# Patient Record
Sex: Female | Born: 1940 | Race: White | Hispanic: No | Marital: Married | Smoking: Never smoker
Health system: Southern US, Community
[De-identification: ages and names within clinical notes are randomized; demographics above are authoritative.]

## PROBLEM LIST (undated history)

## (undated) DIAGNOSIS — F209 Schizophrenia, unspecified: Secondary | ICD-10-CM

## (undated) DIAGNOSIS — F329 Major depressive disorder, single episode, unspecified: Secondary | ICD-10-CM

## (undated) DIAGNOSIS — F0390 Unspecified dementia without behavioral disturbance: Secondary | ICD-10-CM

## (undated) DIAGNOSIS — F32A Depression, unspecified: Secondary | ICD-10-CM

---

## 2003-08-20 ENCOUNTER — Other Ambulatory Visit: Payer: Self-pay

## 2004-04-13 ENCOUNTER — Other Ambulatory Visit: Payer: Self-pay

## 2004-04-14 ENCOUNTER — Inpatient Hospital Stay: Payer: Self-pay | Admitting: Surgery

## 2004-12-28 ENCOUNTER — Ambulatory Visit: Payer: Self-pay | Admitting: Family Medicine

## 2005-01-08 ENCOUNTER — Ambulatory Visit: Payer: Self-pay | Admitting: Internal Medicine

## 2005-08-19 ENCOUNTER — Ambulatory Visit: Payer: Self-pay | Admitting: Urology

## 2006-01-10 ENCOUNTER — Ambulatory Visit: Payer: Self-pay | Admitting: Family Medicine

## 2006-09-05 ENCOUNTER — Ambulatory Visit: Payer: Self-pay | Admitting: Family Medicine

## 2007-01-15 ENCOUNTER — Ambulatory Visit: Payer: Self-pay | Admitting: Family Medicine

## 2008-02-14 ENCOUNTER — Ambulatory Visit: Payer: Self-pay | Admitting: Internal Medicine

## 2008-02-25 ENCOUNTER — Ambulatory Visit: Payer: Self-pay | Admitting: Internal Medicine

## 2009-02-26 ENCOUNTER — Ambulatory Visit: Payer: Self-pay | Admitting: Internal Medicine

## 2010-03-01 ENCOUNTER — Ambulatory Visit: Payer: Self-pay | Admitting: Internal Medicine

## 2011-03-03 ENCOUNTER — Ambulatory Visit: Payer: Self-pay | Admitting: Internal Medicine

## 2012-03-06 ENCOUNTER — Ambulatory Visit: Payer: Self-pay | Admitting: Internal Medicine

## 2012-03-19 ENCOUNTER — Ambulatory Visit: Payer: Self-pay | Admitting: Gastroenterology

## 2013-03-07 ENCOUNTER — Ambulatory Visit: Payer: Self-pay | Admitting: Internal Medicine

## 2014-01-14 ENCOUNTER — Ambulatory Visit: Payer: Self-pay | Admitting: Internal Medicine

## 2014-04-11 ENCOUNTER — Ambulatory Visit: Payer: Self-pay | Admitting: Internal Medicine

## 2014-07-26 ENCOUNTER — Emergency Department: Payer: Self-pay | Admitting: Internal Medicine

## 2014-07-26 LAB — COMPREHENSIVE METABOLIC PANEL
ANION GAP: 10 (ref 7–16)
AST: 40 U/L — AB (ref 15–37)
Albumin: 3.7 g/dL (ref 3.4–5.0)
Alkaline Phosphatase: 64 U/L
BUN: 20 mg/dL — ABNORMAL HIGH (ref 7–18)
Bilirubin,Total: 0.6 mg/dL (ref 0.2–1.0)
CALCIUM: 9.3 mg/dL (ref 8.5–10.1)
CHLORIDE: 107 mmol/L (ref 98–107)
CO2: 25 mmol/L (ref 21–32)
Creatinine: 1.51 mg/dL — ABNORMAL HIGH (ref 0.60–1.30)
EGFR (African American): 43 — ABNORMAL LOW
EGFR (Non-African Amer.): 36 — ABNORMAL LOW
Glucose: 110 mg/dL — ABNORMAL HIGH (ref 65–99)
OSMOLALITY: 286 (ref 275–301)
POTASSIUM: 4.5 mmol/L (ref 3.5–5.1)
SGPT (ALT): 21 U/L
SODIUM: 142 mmol/L (ref 136–145)
Total Protein: 7.7 g/dL (ref 6.4–8.2)

## 2014-07-26 LAB — URINALYSIS, COMPLETE
BILIRUBIN, UR: NEGATIVE
Bacteria: NONE SEEN
GLUCOSE, UR: NEGATIVE mg/dL (ref 0–75)
Hyaline Cast: 2
Nitrite: NEGATIVE
PH: 5 (ref 4.5–8.0)
Protein: 30
RBC,UR: 12 /HPF (ref 0–5)
Specific Gravity: 1.018 (ref 1.003–1.030)
WBC UR: 60 /HPF (ref 0–5)

## 2014-07-26 LAB — CBC
HCT: 39 % (ref 35.0–47.0)
HGB: 12.7 g/dL (ref 12.0–16.0)
MCH: 30.3 pg (ref 26.0–34.0)
MCHC: 32.5 g/dL (ref 32.0–36.0)
MCV: 93 fL (ref 80–100)
Platelet: 292 10*3/uL (ref 150–440)
RBC: 4.18 10*6/uL (ref 3.80–5.20)
RDW: 12.8 % (ref 11.5–14.5)
WBC: 17 10*3/uL — ABNORMAL HIGH (ref 3.6–11.0)

## 2014-07-26 LAB — DRUG SCREEN, URINE

## 2014-07-26 LAB — DIFFERENTIAL
Basophil #: 0.1 10*3/uL (ref 0.0–0.1)
Basophil %: 0.6 %
EOS ABS: 0 10*3/uL (ref 0.0–0.7)
Eosinophil %: 0 %
Lymphocyte #: 1.9 10*3/uL (ref 1.0–3.6)
Lymphocyte %: 11.3 %
Monocyte #: 1.2 x10 3/mm — ABNORMAL HIGH (ref 0.2–0.9)
Monocyte %: 7 %
NEUTROS PCT: 81.1 %
Neutrophil #: 13.8 10*3/uL — ABNORMAL HIGH (ref 1.4–6.5)

## 2014-07-26 LAB — SALICYLATE LEVEL: Salicylates, Serum: <1.7 mg/dL

## 2014-07-26 LAB — ETHANOL: Ethanol: <3 mg/dL

## 2014-07-26 LAB — ACETAMINOPHEN LEVEL: Acetaminophen: <2 ug/mL

## 2014-07-28 LAB — URINE CULTURE

## 2014-07-31 LAB — CULTURE, BLOOD (SINGLE)

## 2014-08-15 ENCOUNTER — Emergency Department: Payer: Self-pay | Admitting: Emergency Medicine
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
## 2014-11-02 NOTE — Consult Note (Signed)
PATIENT NAME:  Kim Vaughn, Kim Vaughn MR#:  [id number] DATE OF BIRTH:  08/26/1940  DATE OF CONSULTATION:  08/15/2014  REFERRING PHYSICIAN:   CONSULTING PHYSICIAN:  Audery Amel, MD  IDENTIFYING INFORMATION AND REASON FOR CONSULTATION: This is a 74 year old woman who came into the hospital voluntarily.   CHIEF COMPLAINT: "I'm not hearing things or seeing things."   HISTORY OF PRESENT ILLNESS: Information obtained from the patient and the chart. The patient tells me that she was at home yesterday when she heard sounds that she believed were people having broken into her house. She was convinced again that someone had broken in and was trying to kill her. She was feeling very frightened and upset about it. The patient states that her mood has been feeling anxious, but denied feeling depressed. She denies suicidal or homicidal ideation. She says that she has been compliant with the medications they gave her at [facility]. She just got out of [facility] a few days ago after a stay for treating the same symptoms she is complaining of now. The patient did not do anything to harm herself, was not aggressive to anyone else, and is not voicing any threats to anyone. No evidence of substance abuse.   PAST PSYCHIATRIC HISTORY: She says that she has had these symptoms intermittently for 20 years. She had a psychiatric hospitalization recently at [facility], which apparently is her only hospitalization, but 20 years ago she said she also saw a psychiatrist. She does not know what diagnosis anyone gave her. She denies any history of suicide attempts. Denies any history of violence.   CURRENT MEDICATIONS: Evidently are olanzapine 10 mg twice a day, Lexapro 10 mg per day, Ativan p.r.n., and  trazodone 50 mg at night p.r.n.   PAST MEDICAL HISTORY: Denies any history of heart attacks or strokes or cancer. She does have high blood pressure.   SOCIAL HISTORY: The patient lives with her  husband. She tells me they have been married about 38 years. She does have an adult child who she stays in intermittent touch with. Says her relationship with her husband is good. She is not voicing any paranoia about him right now.   FAMILY HISTORY: No known family history at all.   SUBSTANCE ABUSE HISTORY: Denies any current or past substance abuse problems.   CURRENT MEDICATIONS: Lexapro 10 mg per day, hydrochlorothiazide 25 mg per day, lisinopril 5 mg per day, lorazepam 0.5 mg every 4 hours as needed for anxiety, Zyprexa 10 mg twice a day, potassium 20 mEq daily, trazodone 50 mg at bedtime.   ALLERGIES: CODEINE, IODINE, PERCOCET.   REVIEW OF SYSTEMS: Currently denies anything. Denies hallucinations, visual or auditory. Denies suicidal or homicidal ideation. Not voicing any complaints of pain or other physical symptoms.   MENTAL STATUS EXAMINATION: Elderly woman, looks her stated age, cooperative with the interview. Eye contact good. Psychomotor activity normal. Speech normal rate, tone and volume. Affect slightly puzzled. Mood stated as okay. Thoughts are lucid without loosening of associations or delusions. Denies auditory or visual hallucinations. Denies suicidal or homicidal ideation. Alert and oriented x 4. Judgment and insight intact. Normal intelligence. Can remember 3/3 objects immediately and 3/3 at 3 minutes.   LABORATORY RESULTS: Chemistry panel: Slightly high glucose 109, BUN elevated at 20, chloride elevated at 108, albumin low at 3.1. Alcohol level, acetaminophen, and salicylates all negative. CBC normal. Urinalysis normal. Drug screen negative.   VITAL SIGNS: Blood pressure 153/79, respirations 18, pulse 80, temperature 98.1.  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 Currently not depressed, not suicidal, not homicidal and her hallucinations have cleared up for the time being and she shows  good insight. The patient does not meet commitment criteria and does not require inpatient hospitalization.   TREATMENT PLAN: Psychoeducation completed. Encouraged the patient to continue medication as it may take longer for the full effect to start. I do not think she needs hospitalization any further and can be released from the hospital. The case discussed with Emergency Room doctor.   DIAGNOSIS, PRINCIPAL AND PRIMARY:  AXIS I: Psychosis, not otherwise specified.   SECONDARY DIAGNOSES: AXIS I: No further.  AXIS II: No diagnosis.  AXIS III: High blood pressure.    ____________________________ Audery AmelJohn T. Akiba Melfi, MD jtc:at D: 08/15/2014 17:18:19 ET T: 08/15/2014 17:56:16 ET JOB#: [id number]  cc: Audery AmelJohn T. Evora Schechter, MD, <Dictator> Audery AmelJOHN T Ophie Burrowes MD ELECTRONICALLY SIGNED 08/18/2014 10:42
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
## 2015-01-01 ENCOUNTER — Other Ambulatory Visit: Payer: Self-pay | Admitting: Internal Medicine

## 2015-01-01 DIAGNOSIS — Z1231 Encounter for screening mammogram for malignant neoplasm of breast: Secondary | ICD-10-CM

## 2015-05-19 ENCOUNTER — Emergency Department
Admission: EM | Admit: 2015-05-19 | Discharge: 2015-05-19 | Disposition: A | Discharge status: Home / Self Care | Payer: Medicare Other | Attending: Student | Admitting: Student

## 2015-05-19 ENCOUNTER — Encounter: Payer: Self-pay | Admitting: Emergency Medicine

## 2015-05-19 DIAGNOSIS — N390 Urinary tract infection, site not specified: Secondary | ICD-10-CM | POA: Diagnosis not present

## 2015-05-19 DIAGNOSIS — R4182 Altered mental status, unspecified: Secondary | ICD-10-CM | POA: Diagnosis present

## 2015-05-19 DIAGNOSIS — R443 Hallucinations, unspecified: Secondary | ICD-10-CM

## 2015-05-19 DIAGNOSIS — F0391 Unspecified dementia with behavioral disturbance: Secondary | ICD-10-CM | POA: Diagnosis not present

## 2015-05-19 HISTORY — DX: Major depressive disorder, single episode, unspecified: F32.9

## 2015-05-19 HISTORY — DX: Depression, unspecified: F32.A

## 2015-05-19 HISTORY — DX: Unspecified dementia, unspecified severity, without behavioral disturbance, psychotic disturbance, mood disturbance, and anxiety: F03.90

## 2015-05-19 LAB — URINALYSIS COMPLETE WITH MICROSCOPIC (ARMC ONLY)
BILIRUBIN URINE: NEGATIVE
Glucose, UA: NEGATIVE mg/dL
HGB URINE DIPSTICK: NEGATIVE
NITRITE: NEGATIVE
PH: 5 (ref 5.0–8.0)
Protein, ur: 30 mg/dL — AB
Specific Gravity, Urine: 1.026 (ref 1.005–1.030)

## 2015-05-19 LAB — CBC
HCT: 40.7 % (ref 35.0–47.0)
HEMOGLOBIN: 13.5 g/dL (ref 12.0–16.0)
MCH: 30.6 pg (ref 26.0–34.0)
MCHC: 33.2 g/dL (ref 32.0–36.0)
MCV: 92.2 fL (ref 80.0–100.0)
PLATELETS: 262 10*3/uL (ref 150–440)
RBC: 4.41 MIL/uL (ref 3.80–5.20)
RDW: 13 % (ref 11.5–14.5)
WBC: 12.9 10*3/uL — ABNORMAL HIGH (ref 3.6–11.0)

## 2015-05-19 LAB — URINE DRUG SCREEN, QUALITATIVE (ARMC ONLY)
Amphetamines, Ur Screen: NOT DETECTED
BARBITURATES, UR SCREEN: NOT DETECTED
BENZODIAZEPINE, UR SCRN: NOT DETECTED
Cannabinoid 50 Ng, Ur ~~LOC~~: NOT DETECTED
Cocaine Metabolite,Ur ~~LOC~~: NOT DETECTED
MDMA (Ecstasy)Ur Screen: NOT DETECTED
METHADONE SCREEN, URINE: NOT DETECTED
OPIATE, UR SCREEN: NOT DETECTED
PHENCYCLIDINE (PCP) UR S: NOT DETECTED
Tricyclic, Ur Screen: NOT DETECTED

## 2015-05-19 LAB — COMPREHENSIVE METABOLIC PANEL
ALT: 17 U/L (ref 14–54)
ANION GAP: 9 (ref 5–15)
AST: 31 U/L (ref 15–41)
Albumin: 4.1 g/dL (ref 3.5–5.0)
Alkaline Phosphatase: 50 U/L (ref 38–126)
BILIRUBIN TOTAL: 1.1 mg/dL (ref 0.3–1.2)
BUN: 33 mg/dL — ABNORMAL HIGH (ref 6–20)
CO2: 26 mmol/L (ref 22–32)
CREATININE: 1.08 mg/dL — AB (ref 0.44–1.00)
Calcium: 9.4 mg/dL (ref 8.9–10.3)
Chloride: 106 mmol/L (ref 101–111)
GFR calc Af Amer: 57 mL/min — ABNORMAL LOW (ref >60)
GFR, EST NON AFRICAN AMERICAN: 49 mL/min — AB (ref >60)
Glucose, Bld: 100 mg/dL — ABNORMAL HIGH (ref 65–99)
Potassium: 3.9 mmol/L (ref 3.5–5.1)
Sodium: 141 mmol/L (ref 135–145)
TOTAL PROTEIN: 7.5 g/dL (ref 6.5–8.1)

## 2015-05-19 LAB — ACETAMINOPHEN LEVEL: Acetaminophen (Tylenol), Serum: <10 ug/mL — ABNORMAL LOW (ref 10–30)

## 2015-05-19 LAB — ETHANOL

## 2015-05-19 LAB — SALICYLATE LEVEL: Salicylate Lvl: <4 mg/dL (ref 2.8–30.0)

## 2015-05-19 MED ORDER — CEPHALEXIN 500 MG PO CAPS
500.0000 mg | ORAL_CAPSULE | Freq: Once | ORAL | Status: AC
  Administered 2015-05-19: 500 mg via ORAL
  Filled 2015-05-19: qty 1

## 2015-05-19 MED ORDER — CEPHALEXIN 500 MG PO CAPS: 500.0000 mg | ORAL_CAPSULE | Freq: Two times a day (BID) | ORAL | Status: AC

## 2015-05-19 NOTE — ED Notes (Addendum)
Pt's spouse is at her bedside - water provided  She has 3+leuks in her urine   EDP gayle will be going in to speak with her and her spouse

## 2015-05-19 NOTE — ED Provider Notes (Signed)
[facility] Emergency Department Provider Note  ____________________________________________  Time seen: Approximately 12:50 PM  I have reviewed the triage vital signs and the nursing notes.   HISTORY  Chief Complaint Hallucinations and Altered Mental Status  Caveat-history of present illness and review of systems limited secondary to the patient's dementia. Information obtained from her husband at bedside.  HPI ROGENE Vaughn is a 74 y.o. female with history of dementia and hypertension who presents for evaluation of nearly one week decreased appetite and delusions and paranoia increased from baseline. Has reports that she is keeping herself locked in her bedroom. He uses the key to open the bedroom but she for the most part she has stayed in there for the past several days. She is eating much less. She is voicing concern that someone is trying to enter the room to kill her. Husband reports this has happened to her in the past in the setting of urinary tract infection. No falls. No vomiting, diarrhea, fevers or chills. The patient has no pain complaints, no complaints at all just stating "I came here to rest a little".   Past Medical History  Diagnosis Date  . Depression   . Dementia     There are no active problems to display for this patient.   History reviewed. No pertinent past surgical history.  Current Outpatient Rx  Name  Route  Sig  Dispense  Refill  . cephALEXin (KEFLEX) 500 MG capsule   Oral   Take 1 capsule (500 mg total) by mouth 2 (two) times daily.   14 capsule   0     Allergies Iodine  History reviewed. No pertinent family history.  Social History Social History  Substance Use Topics  . Smoking status: Never Smoker   . Smokeless tobacco: None  . Alcohol Use: No    Review of Systems Constitutional: No fever/chills Gastrointestinal:  no vomiting.  No diarrhea.    Caveat-history of present illness and review of systems  limited secondary to the patient's dementia. Information obtained from her husband at bedside. ____________________________________________   PHYSICAL EXAM:  VITAL SIGNS: ED Triage Vitals  Enc Vitals Group     BP 05/19/15 1120 154/73 mmHg     Pulse Rate 05/19/15 1120 93     Resp 05/19/15 1120 20     Temp 05/19/15 1120 97.6 F (36.4 C)     Temp Source 05/19/15 1120 Oral     SpO2 05/19/15 1120 95 %     Weight 05/19/15 1120 130 lb (58.968 kg)     Height 05/19/15 1120  (1.549 m)     Head Cir --      Peak Flow --      Pain Score 05/19/15 1121 0     Pain Loc --      Pain Edu? --      Excl. in GC? --     Constitutional: Alert and oriented x 3. Well appearing and in no acute distress. Eyes: Conjunctivae are normal. PERRL. EOMI. Head: Atraumatic. Nose: No congestion/rhinnorhea. Mouth/Throat: Mucous membranes are moist.  Oropharynx non-erythematous. Neck: No stridor. Cardiovascular: Normal rate, regular rhythm. Grossly normal heart sounds.  Good peripheral circulation. Respiratory: Normal respiratory effort.  No retractions. Lungs CTAB. Gastrointestinal: Soft and nontender. No distention.  No CVA tenderness. Genitourinary: deferred Musculoskeletal: No lower extremity tenderness nor edema.  No joint effusions. Neurologic:  Normal speech and language. No gross focal neurologic deficits are appreciated. No gait instability. Skin:  Skin  is warm, dry and intact. No rash noted. Psychiatric: Mood and affect are normal. Speech and behavior are normal.  ____________________________________________   LABS (all labs ordered are listed, but only abnormal results are displayed)  Labs Reviewed  COMPREHENSIVE METABOLIC PANEL - Abnormal; Notable for the following:    Glucose, Bld 100 (*)    BUN 33 (*)    Creatinine, Ser 1.08 (*)    GFR calc non Af Amer 49 (*)    GFR calc Af Amer 57 (*)    All other components within normal limits  CBC - Abnormal; Notable for the following:    WBC  12.9 (*)    All other components within normal limits  ACETAMINOPHEN LEVEL - Abnormal; Notable for the following:    Acetaminophen (Tylenol), Serum <10 (*)    All other components within normal limits  URINALYSIS COMPLETEWITH MICROSCOPIC ([facility] ONLY) - Abnormal; Notable for the following:    Color, Urine YELLOW (*)    APPearance HAZY (*)    Ketones, ur 1+ (*)    Protein, ur 30 (*)    Leukocytes, UA 3+ (*)    Bacteria, UA FEW (*)    Squamous Epithelial / LPF 6-30 (*)    All other components within normal limits  ETHANOL  SALICYLATE LEVEL  URINE DRUG SCREEN, QUALITATIVE ([facility] ONLY)   ____________________________________________  EKG  none ____________________________________________  RADIOLOGY  none ____________________________________________   PROCEDURES  Procedure(s) performed: None  Critical Care performed: No  ____________________________________________   INITIAL IMPRESSION / ASSESSMENT AND PLAN / ED COURSE  Pertinent labs & imaging results that were available during my care of the patient were reviewed by me and considered in my medical decision making (see chart for details).  Kim Vaughn is a 74 y.o. female with history of dementia and hypertension who presents for evaluation of nearly one week decreased appetite and delusions and paranoia increased from baseline. On exam She is very well-appearing and in no acute distress. Vital signs stable, she is afebrile. She has no acute medical complaints and benign examination. Labs reviewed and are notable for very mild creatinine elevation at 1.08. Mild leukocytosis which appears chronic with compared to prior. Urinalysis is concerning for urinary tract infection. We'll treat with Keflex. I suspect urinary tract infection is the cause of her increased paranoia and delusions as well as decreased by mouth appetite and I think she is appropriate for outpatient treatment given her well-appearance. I discussed this  with her husband and he would like to take her home so that she can follow-up for her follow-up appointments next [facility] with [facility] as well as neurology. We discussed treatment with Keflex, immediate ER return precautions and he is comfortable with the discharge plan. ____________________________________________   FINAL CLINICAL IMPRESSION(S) / ED DIAGNOSES  Final diagnoses:  UTI (lower urinary tract infection)  Hallucinations  Dementia, with behavioral disturbance      Gayla DossEryka A Vernee Baines, MD 05/19/15 [phone]

## 2015-05-19 NOTE — ED Notes (Signed)
Discharge instructions reviewed with her and her spouse and they both verbalized agreement and understanding   Follow up instructions also reviewed and her spouse reports that she will have an app[facility] Thursday and an appt for an EEG on Friday  She denies pain at this time
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
## 2015-05-19 NOTE — ED Notes (Addendum)
BEHAVIORAL HEALTH ROUNDING Patient sleeping: No. Patient alert and oriented:  Oriented to self, place Behavior appropriate: Yes.  ; If no, describe:  Nutrition and fluids offered: yes Toileting and hygiene offered: Yes  Sitter present: q15 minute observations and security monitoring Law enforcement present: Yes  ODS

## 2015-05-19 NOTE — ED Notes (Signed)

## 2015-05-19 NOTE — ED Notes (Signed)
BEHAVIORAL HEALTH ROUNDING Patient sleeping: No. Patient alert and oriented: oriented to self and place Behavior appropriate: Yes.  ; If no, describe:  Nutrition and fluids offered: yes Toileting and hygiene offered: Yes  Sitter present: q15 minute observations and security camera monitoring Law enforcement present: Yes  ODS

## 2015-05-21 ENCOUNTER — Emergency Department
Admission: EM | Admit: 2015-05-21 | Discharge: 2015-05-22 | Disposition: A | Discharge status: Other Acute Inpatient Hospital | Payer: Medicare Other | Attending: Emergency Medicine | Admitting: Emergency Medicine

## 2015-05-21 ENCOUNTER — Encounter: Payer: Self-pay | Admitting: Emergency Medicine

## 2015-05-21 DIAGNOSIS — I10 Essential (primary) hypertension: Secondary | ICD-10-CM | POA: Insufficient documentation

## 2015-05-21 DIAGNOSIS — Z792 Long term (current) use of antibiotics: Secondary | ICD-10-CM | POA: Insufficient documentation

## 2015-05-21 DIAGNOSIS — F22 Delusional disorders: Secondary | ICD-10-CM | POA: Insufficient documentation

## 2015-05-21 DIAGNOSIS — R443 Hallucinations, unspecified: Secondary | ICD-10-CM | POA: Diagnosis present

## 2015-05-21 DIAGNOSIS — N390 Urinary tract infection, site not specified: Secondary | ICD-10-CM | POA: Diagnosis not present

## 2015-05-21 LAB — URINALYSIS COMPLETE WITH MICROSCOPIC (ARMC ONLY)
BILIRUBIN URINE: NEGATIVE
GLUCOSE, UA: NEGATIVE mg/dL
HGB URINE DIPSTICK: NEGATIVE
NITRITE: NEGATIVE
Protein, ur: 30 mg/dL — AB
SPECIFIC GRAVITY, URINE: 1.02 (ref 1.005–1.030)
pH: 6 (ref 5.0–8.0)

## 2015-05-21 LAB — COMPREHENSIVE METABOLIC PANEL
ALT: 18 U/L (ref 14–54)
AST: 26 U/L (ref 15–41)
Albumin: 4 g/dL (ref 3.5–5.0)
Alkaline Phosphatase: 48 U/L (ref 38–126)
Anion gap: 9 (ref 5–15)
BILIRUBIN TOTAL: 1.2 mg/dL (ref 0.3–1.2)
BUN: 20 mg/dL (ref 6–20)
CALCIUM: 9.5 mg/dL (ref 8.9–10.3)
CHLORIDE: 102 mmol/L (ref 101–111)
CO2: 28 mmol/L (ref 22–32)
CREATININE: 1.01 mg/dL — AB (ref 0.44–1.00)
GFR, EST NON AFRICAN AMERICAN: 53 mL/min — AB (ref >60)
Glucose, Bld: 101 mg/dL — ABNORMAL HIGH (ref 65–99)
Potassium: 4.1 mmol/L (ref 3.5–5.1)
Sodium: 139 mmol/L (ref 135–145)
TOTAL PROTEIN: 7.7 g/dL (ref 6.5–8.1)

## 2015-05-21 LAB — CBC
HCT: 39.8 % (ref 35.0–47.0)
Hemoglobin: 13.1 g/dL (ref 12.0–16.0)
MCH: 30.7 pg (ref 26.0–34.0)
MCHC: 33 g/dL (ref 32.0–36.0)
MCV: 93 fL (ref 80.0–100.0)
PLATELETS: 258 10*3/uL (ref 150–440)
RBC: 4.28 MIL/uL (ref 3.80–5.20)
RDW: 12.7 % (ref 11.5–14.5)
WBC: 10.9 10*3/uL (ref 3.6–11.0)

## 2015-05-21 MED ORDER — LORAZEPAM 0.5 MG PO TABS: 0.5000 mg | ORAL_TABLET | Freq: Four times a day (QID) | ORAL | Status: DC | PRN

## 2015-05-21 MED ORDER — OLANZAPINE 5 MG PO TABS
2.5000 mg | ORAL_TABLET | Freq: Every day | ORAL | Status: DC
  Administered 2015-05-22: 2.5 mg via ORAL
  Filled 2015-05-21: qty 0.5

## 2015-05-21 NOTE — ED Notes (Signed)
Pt ambulated to bathroom at this time independently with no concerns, pt tolerated well at this time.

## 2015-05-21 NOTE — ED Notes (Signed)
SOC machine placed at bedside at this time

## 2015-05-21 NOTE — ED Notes (Signed)
Pt brought to ed from [facility] via [facility] for hallucinations. Pt has been paranoid and thinking that someone is putting something in her food. Recently dx with UTI and will not take meds for same and will not eat. Also thinks people are out to get her.  Pt with IVC papers.
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
## 2015-05-22 DIAGNOSIS — F22 Delusional disorders: Secondary | ICD-10-CM | POA: Diagnosis not present

## 2015-05-22 MED ORDER — CEPHALEXIN 500 MG PO CAPS
500.0000 mg | ORAL_CAPSULE | Freq: Four times a day (QID) | ORAL | Status: DC
  Administered 2015-05-22 (×2): 500 mg via ORAL
  Filled 2015-05-22 (×2): qty 1

## 2015-05-22 NOTE — ED Provider Notes (Signed)
-----------------------------------------   5:36 AM on 05/22/2015 -----------------------------------------   Blood pressure 140/61, pulse 69, temperature 98.1 F (36.7 C), temperature source Oral, SpO2 97 %.  The patient had no acute events since last update.  Calm and cooperative at this time.  Disposition is pending per Psychiatry/Behavioral Medicine team recommendations.     Loleta Roseory Biruk Troia, MD 05/22/15 [phone]

## 2015-05-22 NOTE — ED Notes (Signed)
BEHAVIORAL HEALTH ROUNDING Patient sleeping: Yes.   Patient alert and oriented: not applicable Behavior appropriate: Yes.  ; If no, describe:  Nutrition and fluids offered: Yes  Toileting and hygiene offered: Yes  Sitter present: no Law enforcement present: Yes  

## 2015-05-22 NOTE — ED Notes (Signed)
BEHAVIORAL HEALTH ROUNDING Patient sleeping: No. Patient alert and oriented: no Behavior appropriate: Yes.  ; If no, describe:  Nutrition and fluids offered: Yes  Toileting and hygiene offered: Yes  Sitter present: no Law enforcement present: Yes  

## 2015-05-22 NOTE — ED Notes (Signed)
BEHAVIORAL HEALTH ROUNDING Patient sleeping: No. Patient alert and oriented: no Behavior appropriate: Yes.  ; If no, describe: saying she thinks someone is going to shoot thru the walls, wants staff to be very quiet, attempted reassurance to pt Nutrition and fluids offered: Yes  Toileting and hygiene offered: Yes  Sitter present: no Law enforcement present: Yes

## 2015-05-22 NOTE — ED Notes (Signed)

## 2015-05-22 NOTE — ED Notes (Signed)
BEHAVIORAL HEALTH ROUNDING Patient sleeping: Yes.   Patient alert and oriented: no Behavior appropriate: Yes.  ; If no, describe:  Nutrition and fluids offered: Yes  Toileting and hygiene offered: Yes  Sitter present: no Law enforcement present: Yes  

## 2015-05-22 NOTE — ED Notes (Signed)
Pt husband with her

## 2015-05-22 NOTE — ED Notes (Signed)
Patient resting comfortably in room. No complaints or concerns voiced. No distress or abnormal behavior noted. Will continue to monitor with security cameras. Q 15 minute rounds continue. 

## 2015-05-22 NOTE — ED Notes (Signed)

## 2015-05-22 NOTE — ED Notes (Signed)
BEHAVIORAL HEALTH ROUNDING Patient sleeping: Yes.   Patient alert and oriented: not applicable Behavior appropriate: Yes.  ; If no, describe: sleeping Nutrition and fluids offered: Yes  Toileting and hygiene offered: Yes  Sitter present: no Law enforcement present: Yes  

## 2015-05-22 NOTE — ED Notes (Signed)
Report received from Lauryn RN. Patient care assumed. Patient/RN introduction complete. Will continue to monitor.  

## 2015-05-22 NOTE — ED Notes (Signed)
No Zyprexa in pyxis at this time, pharmacy called, will send to ED. Pt made aware and verbalized understanding at this time

## 2015-05-22 NOTE — [facility] Counselor (Signed)
Pt [facility] been [facility], [facility].   Pt's husband has been informed of SOC recommendation for inpatient placement. Husband stated that Pt has appt. Scheduled [facility] and inquired if Pt would be able to attend. Writer provided husband with phone number so that he may contact Pt's RN.

## 2015-05-22 NOTE — ED Notes (Signed)
Pt resting in bed comfortably with eyes closed, no distress or discomfort noted, will continue to monitor.  Safety rounds being performed q15 min per md order.

## 2015-05-22 NOTE — BH Assessment (Signed)
[facility].  Accepting physician is Dr. Althea Grimmerosado.  Call report to [phone] Representative was Cedric.  ER Staff Misty Stanley(Lisa, ER Sect.; Dr. Alphonzo LemmingsMcShane, ER MD & Nicholos JohnsKathleen Patient's Nurse) have been made aware it.   Pt.'s Family/Support System Ebony Cargo(Clayton Sakai/[phone]) have been updated as well.

## 2015-05-22 NOTE — ED Notes (Signed)
Patient assigned to appropriate care area. Patient oriented to unit/care area: Informed that, for their safety, care areas are designed for safety and monitored by security cameras at all times; and visiting hours explained to patient. Patient verbalizes understanding, and verbal contract for safety obtained. 

## 2015-05-24 LAB — URINE CULTURE

## 2015-08-13 ENCOUNTER — Emergency Department
Admission: EM | Admit: 2015-08-13 | Discharge: 2015-08-14 | Disposition: A | Discharge status: Other Acute Inpatient Hospital | Payer: Medicare Other | Attending: Emergency Medicine | Admitting: Emergency Medicine

## 2015-08-13 ENCOUNTER — Encounter: Payer: Self-pay | Admitting: Emergency Medicine

## 2015-08-13 DIAGNOSIS — F209 Schizophrenia, unspecified: Secondary | ICD-10-CM | POA: Diagnosis not present

## 2015-08-13 DIAGNOSIS — F200 Paranoid schizophrenia: Secondary | ICD-10-CM | POA: Diagnosis not present

## 2015-08-13 DIAGNOSIS — Z79899 Other long term (current) drug therapy: Secondary | ICD-10-CM | POA: Insufficient documentation

## 2015-08-13 DIAGNOSIS — Z792 Long term (current) use of antibiotics: Secondary | ICD-10-CM | POA: Insufficient documentation

## 2015-08-13 DIAGNOSIS — Z046 Encounter for general psychiatric examination, requested by authority: Secondary | ICD-10-CM

## 2015-08-13 DIAGNOSIS — F0390 Unspecified dementia without behavioral disturbance: Secondary | ICD-10-CM

## 2015-08-13 DIAGNOSIS — R443 Hallucinations, unspecified: Secondary | ICD-10-CM | POA: Diagnosis present

## 2015-08-13 HISTORY — DX: Schizophrenia, unspecified: F20.9

## 2015-08-13 LAB — COMPREHENSIVE METABOLIC PANEL
ALK PHOS: 50 U/L (ref 38–126)
ALT: 13 U/L — AB (ref 14–54)
AST: 17 U/L (ref 15–41)
Albumin: 4.3 g/dL (ref 3.5–5.0)
Anion gap: 11 (ref 5–15)
BUN: 23 mg/dL — ABNORMAL HIGH (ref 6–20)
CHLORIDE: 105 mmol/L (ref 101–111)
CO2: 26 mmol/L (ref 22–32)
Calcium: 9.4 mg/dL (ref 8.9–10.3)
Creatinine, Ser: 1 mg/dL (ref 0.44–1.00)
GFR calc Af Amer: >60 mL/min (ref >60)
GFR calc non Af Amer: 54 mL/min — ABNORMAL LOW (ref >60)
Glucose, Bld: 114 mg/dL — ABNORMAL HIGH (ref 65–99)
Potassium: 3.9 mmol/L (ref 3.5–5.1)
SODIUM: 142 mmol/L (ref 135–145)
Total Bilirubin: 0.5 mg/dL (ref 0.3–1.2)
Total Protein: 7.4 g/dL (ref 6.5–8.1)

## 2015-08-13 LAB — SALICYLATE LEVEL

## 2015-08-13 LAB — CBC
HCT: 41 % (ref 35.0–47.0)
Hemoglobin: 13.4 g/dL (ref 12.0–16.0)
MCH: 30.9 pg (ref 26.0–34.0)
MCHC: 32.8 g/dL (ref 32.0–36.0)
MCV: 94.2 fL (ref 80.0–100.0)
PLATELETS: 236 10*3/uL (ref 150–440)
RBC: 4.35 MIL/uL (ref 3.80–5.20)
RDW: 13.2 % (ref 11.5–14.5)
WBC: 10.3 10*3/uL (ref 3.6–11.0)

## 2015-08-13 LAB — ACETAMINOPHEN LEVEL

## 2015-08-13 LAB — ETHANOL

## 2015-08-13 MED ORDER — OLANZAPINE 10 MG PO TBDP
10.0000 mg | ORAL_TABLET | Freq: Every day | ORAL | Status: DC
  Administered 2015-08-13: 10 mg via ORAL
  Filled 2015-08-13 (×2): qty 1

## 2015-08-13 NOTE — BH Assessment (Signed)
Referral information for Geriatric Placement have been faxed to;    [facility]([phone]),    [facility]([phone]),    [phone]),    [facility]([phone]),    [facility] [phone])    [facility] [phone]).

## 2015-08-13 NOTE — [facility] Counselor (Signed)
Patient has been accepted at [facility].  Admitting MD is Dr. Roselle Locus.  Pt can report after 9 am tomorrow.  Call report to [phone].

## 2015-08-13 NOTE — ED Provider Notes (Addendum)
[facility] Emergency Department Provider Note  ____________________________________________   I have reviewed the triage vital signs and the nursing notes.   HISTORY  Chief Complaint Hallucinations    HPI Kim Vaughn is a 75 y.o. female who presents today complaining of nothing. She states that she does not want to be here and she is not suffering from hallucinations she is taking her medications and she wants to go home. However she is under IVC commitment. Paperwork says that she is not taking her meds, she is wandering off trying to find people that do not exist, she is hallucinating, and she is worried about people living on the second story of a one-story house. Patient denies all of this. She has no complaints.  Past Medical History  Diagnosis Date  . Depression   . Dementia   . Schizophrenia [facility])     Patient Active Problem List   Diagnosis Date Noted  . Schizophrenia (HCC) 08/13/2015  . Dementia 08/13/2015    No past surgical history on file.  Current Outpatient Rx  Name  Route  Sig  Dispense  Refill  . escitalopram (LEXAPRO) 10 MG tablet   Oral   Take 10 mg by mouth daily.         . QUEtiapine (SEROQUEL) 100 MG tablet   Oral   Take 100 mg by mouth at bedtime.         . cephALEXin (KEFLEX) 500 MG capsule   Oral   Take 1 capsule (500 mg total) by mouth 2 (two) times daily.   14 capsule   0     Allergies Iodine  No family history on file.  Social History Social History  Substance Use Topics  . Smoking status: Never Smoker   . Smokeless tobacco: None  . Alcohol Use: No    Review of Systems Constitutional: No fever/chills Eyes: No visual changes. ENT: No sore throat. No stiff neck no neck pain Cardiovascular: Denies chest pain. Respiratory: Denies shortness of breath. Gastrointestinal:   no vomiting.  No diarrhea.  No constipation. Genitourinary: Negative for dysuria. Musculoskeletal:  Negative lower extremity swelling Skin: Negative for rash. Neurological: Negative for headaches, focal weakness or numbness. 10-point ROS otherwise negative.  ____________________________________________   PHYSICAL EXAM:  VITAL SIGNS: ED Triage Vitals  Enc Vitals Group     BP 08/13/15 1711 170/86 mmHg     Pulse Rate 08/13/15 1711 75     Resp 08/13/15 1711 16     Temp 08/13/15 1711 97.7 F (36.5 C)     Temp Source 08/13/15 1711 Oral     SpO2 08/13/15 1711 95 %     Weight --      Height --      Head Cir --      Peak Flow --      Pain Score --      Pain Loc --      Pain Edu? --      Excl. in GC? --     Constitutional: Alert and oriented. Well appearing and in no acute distress. Eyes: Conjunctivae are normal. PERRL. EOMI. Head: Atraumatic. Nose: No congestion/rhinnorhea. Mouth/Throat: Mucous membranes are moist.  Oropharynx non-erythematous. Neck: No stridor.   Nontender with no meningismus Cardiovascular: Normal rate, regular rhythm. Grossly normal heart sounds.  Good peripheral circulation. Respiratory: Normal respiratory effort.  No retractions. Lungs CTAB. Abdominal: Soft and nontender. No distention. No guarding no rebound Back:  There is no focal tenderness or  step off there is no midline tenderness there are no lesions noted. there is no CVA tenderness Musculoskeletal: No lower extremity tenderness. No joint effusions, no DVT signs strong distal pulses no edema Neurologic:  Normal speech and language. No gross focal neurologic deficits are appreciated.  Skin:  Skin is warm, dry and intact. No rash noted. Psychiatric: Mood and affect are normal. Speech and behavior are normal.  ____________________________________________   LABS (all labs ordered are listed, but only abnormal results are displayed)  Labs Reviewed  COMPREHENSIVE METABOLIC PANEL - Abnormal; Notable for the following:    Glucose, Bld 114 (*)    BUN 23 (*)    ALT 13 (*)    GFR calc non Af Amer 54  (*)    All other components within normal limits  CBC  ETHANOL  SALICYLATE LEVEL  ACETAMINOPHEN LEVEL  URINE DRUG SCREEN, QUALITATIVE ([facility] ONLY)   ____________________________________________  EKG  I personally interpreted any EKGs ordered by me or triage Sinus bradycardia rate 59 bpm no acute ST elevation or acute ST depression nonspecific ST changes noted ____________________________________________  RADIOLOGY  I reviewed any imaging ordered by me or triage that were performed during my shift ____________________________________________   PROCEDURES  Procedure(s) performed: None  Critical Care performed: None  ____________________________________________   INITIAL IMPRESSION / ASSESSMENT AND PLAN / ED COURSE  Pertinent labs & imaging results that were available during my care of the patient were reviewed by me and considered in my medical decision making (see chart for details).  Patient here under IVC paperwork for psychotic behavior, this time she is awake alert oriented in no acute distress. We'll ask psychiatry to evaluate her. ____________________________________________   FINAL CLINICAL IMPRESSION(S) / ED DIAGNOSES  Final diagnoses:  None      This chart was dictated using voice recognition software.  Despite best efforts to proofread,  errors can occur which can change meaning.     Jeanmarie Plant, MD 08/13/15 1809  Jeanmarie Plant, MD 08/13/15 2024

## 2015-08-13 NOTE — ED Notes (Signed)
Pt here IVC from [facility] due to hallucinations and noncompliance with schiphrenic medications.

## 2015-08-13 NOTE — Consult Note (Signed)
[place] Psychiatry Consult   Reason for Consult:  Consult for 75 year old woman with a history of psychotic symptoms Referring Physician:  Mc[place]ane Patient Identification: Kim Vaughn MRN:  [id number] Principal Diagnosis: Schizophrenia [facility]) Diagnosis:   Patient Active Problem List   Diagnosis Date Noted  . Schizophrenia ([place]) [F20.9] 08/13/2015  . Dementia [F03.90] 08/13/2015    Total Time spent with patient: 45 minutes  Subjective:   Kim Vaughn is a 75 y.o. female patient admitted with "my husband had me sent here".  HPI:  Patient interviewed. Chart reviewed. Old notes reviewed. Current paperwork sent with her reviewed and case discussed with emergency room doctor. 75 year old woman who has past history of psychotic symptoms and one previous psychiatric hospitalization was sent here on IVC by mobile crisis. They report that she has been noncompliant with medication and appears to be confused and has been reporting hearing people inside her house. The patient is fairly uncooperative with the interview today. Denies everything. Denies having any idea why she was sent here at all. She does tell me that her sisters live in her house upstairs at times which apparently is what they were referring to. She denies any suicidal or homicidal ideation. She claims that she takes her medicines but denies knowing what any of them are. She denies any physical symptoms at all.  Social history: Lives with her husband. Has an adult child who does not live at home.  Amy history: No known family history of mental illness  Medical history: Patient denies having any significant ongoing medical problems.    Past Psychiatric History: Patient is having one previous psychiatric hospitalization at [facility]. She was later brought here to the emergency room but then released. Unclear whether the diagnosis ultimately was late onset schizophrenia or psychotic depression. Previously had been  prescribed olanzapine. Patient denies that she's ever tried to harm herself in the past.  Risk to Self: Is patient at risk for suicide?: No Risk to Others:   Prior Inpatient Therapy:   Prior Outpatient Therapy:    Past Medical History:  Past Medical History  Diagnosis Date  . Depression   . Dementia   . Schizophrenia ([place])    No past surgical history on file. Family History: No family history on file. Family Psychiatric  History: No known family history of mental health problems or substance abuse Social History:  History  Alcohol Use No     History  Drug Use No    Social History   Social History  . Marital Status: Married    Spouse Name: N/A  . Number of Children: N/A  . Years of Education: N/A   Social History Main Topics  . Smoking status: Never Smoker   . Smokeless tobacco: None  . Alcohol Use: No  . Drug Use: No  . Sexual Activity: Not Asked   Other Topics Concern  . None   Social History Narrative   Additional Social History:    Allergies:   Allergies  Allergen Reactions  . Iodine Rash    Labs:  Results for orders placed or performed during the hospital encounter of 08/13/15 (from the past 48 hour(s))  Comprehensive metabolic panel     Status: Abnormal   Collection Time: 08/13/15  5:19 PM  Result Value Ref Range   Sodium 142 135 - 145 mmol/L   Potassium 3.9 3.5 - 5.1 mmol/L   Chloride 105 101 - 111 mmol/L   CO2 26 22 - 32 mmol/L  Glucose, Bld 114 (H) 65 - 99 mg/dL   BUN 23 (H) 6 - 20 mg/dL   Creatinine, Ser 1.00 0.44 - 1.00 mg/dL   Calcium 9.4 8.9 - 10.3 mg/dL   Total Protein 7.4 6.5 - 8.1 g/dL   Albumin 4.3 3.5 - 5.0 g/dL   AST 17 15 - 41 U/L   ALT 13 (L) 14 - 54 U/L   Alkaline Phosphatase 50 38 - 126 U/L   Total Bilirubin 0.5 0.3 - 1.2 mg/dL   GFR calc non Af Amer 54 (L) >60 mL/min   GFR calc Af Amer >60 >60 mL/min    Comment: (NOTE) The eGFR has been calculated using the CKD EPI equation. This calculation has not been validated in  all clinical situations. eGFR's persistently <60 mL/min signify possible Chronic Kidney Disease.    Anion gap 11 5 - 15  CBC     Status: None   Collection Time: 08/13/15  5:19 PM  Result Value Ref Range   WBC 10.3 3.6 - 11.0 K/uL   RBC 4.35 3.80 - 5.20 MIL/uL   Hemoglobin 13.4 12.0 - 16.0 g/dL   HCT 41.0 35.0 - 47.0 %   MCV 94.2 80.0 - 100.0 fL   MCH 30.9 26.0 - 34.0 pg   MCHC 32.8 32.0 - 36.0 g/dL   RDW 13.2 11.5 - 14.5 %   Platelets 236 150 - 440 K/uL    No current facility-administered medications for this encounter.   Current Outpatient Prescriptions  Medication Sig Dispense Refill  . escitalopram (LEXAPRO) 10 MG tablet Take 10 mg by mouth daily.    . QUEtiapine (SEROQUEL) 100 MG tablet Take 100 mg by mouth at bedtime.    . cephALEXin (KEFLEX) 500 MG capsule Take 1 capsule (500 mg total) by mouth 2 (two) times daily. 14 capsule 0    Musculoskeletal: Strength & Muscle Tone: within normal limits Gait & Station: normal Patient leans: N/A  Psychiatric Specialty Exam: Review of Systems  Constitutional: Negative.   HENT: Negative.   Eyes: Negative.   Respiratory: Negative.   Cardiovascular: Negative.   Gastrointestinal: Negative.   Musculoskeletal: Negative.   Skin: Negative.   Neurological: Negative.   Psychiatric/Behavioral: Negative for depression, suicidal ideas, hallucinations, memory loss and substance abuse. The patient is not nervous/anxious and does not have insomnia.     Blood pressure 170/86, pulse 75, temperature 97.7 F (36.5 C), temperature source Oral, resp. rate 16, SpO2 95 %.There is no weight on file to calculate BMI.  General Appearance: Fairly Groomed  Engineer, water::  Minimal  Speech:  Slow  Volume:  Decreased  Mood:  Euthymic  Affect:  Flat  Thought Process:  Linear  Orientation:  Full (Time, Place, and Person)  Thought Content:  Denies any but does appear to be a little paranoid and possibly delusional  Suicidal Thoughts:  No  Homicidal  Thoughts:  No  Memory:  Immediate;   Fair Recent;   Poor Remote;   Poor  Judgement:  Poor  Insight:  Shallow  Psychomotor Activity:  Decreased  Concentration:  Poor  Recall:  Poor  Fund of Knowledge:Poor  Language: Poor  Akathisia:  No  Handed:  Right  AIMS (if indicated):     Assets:  Housing Physical Health Resilience Social Support  ADL's:  Intact  Cognition: Impaired,  Moderate  Sleep:      Treatment Plan Summary: Plan Patient will be referred to geriatric psychiatry unit. Continue IVC. Continue olanzapine. Review case with  emergency room physician and psychiatric staff. Appears to be psychotic and medicine noncompliant.  Disposition: Recommend psychiatric Inpatient admission when medically cleared. Supportive therapy provided about ongoing stressors.  Alethia Berthold, MD 08/13/2015 6:08 PM

## 2015-08-13 NOTE — BH Assessment (Signed)
Assessment Note  Kim Vaughn is an 75 y.o. female Who presents to the ER via Patent examiner, after being petitioned to be under IVC, by [employer] Administrator, arts. Patient was taking there by her husband Kim Vaughn-[phone]) because she is having A/H and believing family members are living in her home, "Upstairs."  Patient has a history of Schizophrenia and was inpatient 05/2015 for similar presentation. Patient has stop taking her medications, because of the beliefs she, she is well and no longer need them.  During interview, patient was guarded and talking soft.  Patient denies SI/HI. Per the husband, she is having AV/H but patient is denying it.  Diagnosis: Schizophrenia   Past Medical History:  Past Medical History  Diagnosis Date  . Depression   . Dementia   . Schizophrenia ([facility])     No past surgical history on file.  Family History: No family history on file.  Social History:  reports that she has never smoked. She does not have any smokeless tobacco history on file. She reports that she does not drink alcohol or use illicit drugs.  Additional Social History:  Alcohol / Drug Use Pain Medications: None Reported Prescriptions: None Reported Over the Counter: None Reported History of alcohol / drug use?: No history of alcohol / drug abuse Longest period of sobriety (when/how long): No history of alcolol/drug abuse Negative Consequences of Use:  (No history of alcolol/drug abuse) Withdrawal Symptoms:  (No history of alcolol/drug abuse)  CIWA: CIWA-Ar BP: (!) 138/59 mmHg Pulse Rate: 61 COWS:    Allergies:  Allergies  Allergen Reactions  . Iodine Rash    Home Medications:  (Not in a hospital admission)  OB/GYN Status:  No LMP recorded. Patient is postmenopausal.  General Assessment Data Location of Assessment: [facility] ED TTS Assessment: In system Is this a Tele or Face-to-Face Assessment?: Face-to-Face Is this an Initial Assessment  or a Re-assessment for this encounter?: Initial Assessment Marital status: Married [place] name: n/a Is patient pregnant?: No Pregnancy Status: No Living Arrangements: Spouse/significant other Can pt return to current living arrangement?: Yes Admission Status: Involuntary Is patient capable of signing voluntary admission?: No Referral Source: Self/Family/Friend Insurance type: Medicare  Medical Screening Exam [facility] Walk-in ONLY) Medical Exam completed: Yes  Crisis Care Plan Living Arrangements: Spouse/significant other Legal Guardian: Other: (None) Name of Psychiatrist: [facility] Name of Therapist: [facility]  Education Status Is patient currently in school?: No Current Grade: n/a Highest grade of school patient has completed: Patient didn't say Name of school: n/a Contact person: n/a  Risk to self with the past 6 months Suicidal Ideation: No Has patient been a risk to self within the past 6 months prior to admission? : No Suicidal Intent: No Has patient had any suicidal intent within the past 6 months prior to admission? : No Is patient at risk for suicide?: No Suicidal Plan?: No Has patient had any suicidal plan within the past 6 months prior to admission? : No Access to Means: No What has been your use of drugs/alcohol within the last 12 months?: No use reported Previous Attempts/Gestures: No How many times?: 0 Other Self Harm Risks: None Reported Triggers for Past Attempts: None known Intentional Self Injurious Behavior: None Family Suicide History: No Recent stressful life event(s): Other (Comment) (Stop taking her medications) Persecutory voices/beliefs?: Yes Depression: Yes Depression Symptoms: Loss of interest in usual pleasures, Feeling worthless/self pity, Fatigue, Isolating Substance abuse history and/or treatment for substance abuse?: No Suicide prevention information given to  non-admitted patients: Not applicable  Risk to Others within the past 6 months Homicidal  Ideation: No Does patient have any lifetime risk of violence toward others beyond the six months prior to admission? : No Thoughts of Harm to Others: No Current Homicidal Intent: No Current Homicidal Plan: No Access to Homicidal Means: No Identified Victim: None Reported History of harm to others?: No Assessment of Violence: None Noted Violent Behavior Description: None Reported Does patient have access to weapons?: No Criminal Charges Pending?: No Does patient have a court date: No Is patient on probation?: No  Psychosis Hallucinations: Auditory, Visual Delusions: None noted  Mental Status Report Appearance/Hygiene: In hospital gown, In scrubs, Unremarkable Eye Contact: Fair Motor Activity: Unremarkable Speech: Soft Level of Consciousness: Alert Mood: Anxious, Suspicious, Sad Affect: Appropriate to circumstance, Sad, Flat Anxiety Level: Minimal Thought Processes: Tangential Judgement: Partial Orientation: Person, Place Obsessive Compulsive Thoughts/Behaviors: Minimal  Cognitive Functioning Concentration: Normal Memory: Recent Intact, Remote Intact IQ: Average Insight: Poor Impulse Control: Poor Appetite: Fair Weight Loss: 0 Weight Gain: 0 Sleep: Decreased Total Hours of Sleep: 3 Vegetative Symptoms: None  ADLScreening [facility] Assessment Services) Patient's cognitive ability adequate to safely complete daily activities?: Yes Patient able to express need for assistance with ADLs?: Yes Independently performs ADLs?: Yes (appropriate for developmental age)  Prior Inpatient Therapy Prior Inpatient Therapy: Yes Prior Therapy Dates: 2016 Prior Therapy Facilty/Provider(s): [facility] Reason for Treatment: Schizophrenia   Prior Outpatient Therapy Prior Outpatient Therapy: Yes Prior Therapy Dates: Current Prior Therapy Facilty/Provider(s): [facility] Reason for Treatment: Schizophrenia  Does patient have an ACCT team?: No Does patient have Intensive In-House  Services?  : No Does patient have [facility] services? : No Does patient have P4CC services?: No  ADL Screening (condition at time of admission) Patient's cognitive ability adequate to safely complete daily activities?: Yes Patient able to express need for assistance with ADLs?: Yes Independently performs ADLs?: Yes (appropriate for developmental age)       Abuse/Neglect Assessment (Assessment to be complete while patient is alone) Physical Abuse: Denies Verbal Abuse: Denies Sexual Abuse: Denies Exploitation of patient/patient's resources: Denies Self-Neglect: Denies Values / Beliefs Cultural Requests During Hospitalization: None Spiritual Requests During Hospitalization: None Consults Spiritual Care Consult Needed: No Social Work Consult Needed: No Merchant navy officer (For Healthcare) Does patient have an advance directive?: No Would patient like information on creating an advanced directive?: No - patient declined information    Additional Information 1:1 In Past 12 Months?: No CIRT Risk: No Elopement Risk: No Does patient have medical clearance?: Yes  Child/Adolescent Assessment Running Away Risk: Denies (Patient is an adult)  Disposition:  Disposition Initial Assessment Completed for this Encounter: Yes Disposition of Patient: Inpatient treatment program Type of inpatient treatment program: Adult (Geriatric Psych)  On Site Evaluation by:   Reviewed with Physician:    Lilyan Gilford, MS, LCAS, LPC, NCC, CCSI 08/13/2015 8:31 PM

## 2015-08-14 DIAGNOSIS — F200 Paranoid schizophrenia: Secondary | ICD-10-CM | POA: Insufficient documentation

## 2015-08-14 LAB — URINE DRUG SCREEN, QUALITATIVE (ARMC ONLY)
AMPHETAMINES, UR SCREEN: NOT DETECTED
BARBITURATES, UR SCREEN: NOT DETECTED
BENZODIAZEPINE, UR SCRN: NOT DETECTED
CANNABINOID 50 NG, UR ~~LOC~~: NOT DETECTED
Cocaine Metabolite,Ur ~~LOC~~: NOT DETECTED
MDMA (ECSTASY) UR SCREEN: NOT DETECTED
METHADONE SCREEN, URINE: NOT DETECTED
Opiate, Ur Screen: NOT DETECTED
PHENCYCLIDINE (PCP) UR S: NOT DETECTED
Tricyclic, Ur Screen: NOT DETECTED

## 2015-08-14 NOTE — Progress Notes (Signed)
TTS has spoken with admissions department at [facility] and informed them that the pt will likely be transported on tonight due to delays in transportation via the [facility] Department.   08/14/2015 Cheryl Flash, MS, NCC, LPCA Therapeutic Triage Specialist

## 2015-08-14 NOTE — ED Notes (Signed)
Pt ambulated to bathroom x assist of 1 with no concerns. Pt tolerated well, NAD noted at this time.

## 2015-08-14 NOTE — Consult Note (Signed)
  Psychiatry: Follow-up 75 year old woman with a history of psychosis currently in the emergency room. Patient seen chart reviewed. Patient has no new complaints. She remains confused and communicates very little. Doesn't remember having been given any medicine. Still seems a little paranoid but has not been agitated. Vital signs are stable.  Continues to be paranoid and withdrawn and off of her baseline. Hopefully there is some progress being made in getting her refer to geriatric psychiatry unit. No change to medicine today.

## 2015-08-14 NOTE — ED Provider Notes (Signed)
-----------------------------------------   7:19 AM on 08/14/2015 -----------------------------------------   Blood pressure 138/59, pulse 61, temperature 98.1 F (36.7 C), temperature source Oral, resp. rate 16, SpO2 98 %.  The patient had no acute events since last update.  Calm and cooperative at this time.  Pending transfer to [facility] later this morning.   Loleta Rose, MD 08/14/15 [phone]

## 2015-08-14 NOTE — ED Notes (Signed)
Pt left exam room ambulatory accompanied by female sheriff officer who was transporting pt. Called [place] at [phone] Informed Candice from Admission pt on her way.

## 2015-08-14 NOTE — ED Notes (Signed)
Patient is awake, alert, and oriented x 3.  Denies hallucinations.  She is calm and cooperative.  Discussed immediate plan of care and plan to transfer patient to [facility] today.  Verbalizes understanding.  Continue to monitor.

## 2015-08-14 NOTE — ED Notes (Signed)
Patient's husband into visit.  Discussed planned transfer to [facility].  Directions and facility phone number given to patient's husband. Continue to monitor.

## 2015-08-14 NOTE — ED Notes (Signed)
Resting comfortably.  Skin warm and dry.  NAD.  [facility] called to give report, Intake Nurse not available due to triaging another patient.  Will call back in 10-15 minutes.

## 2015-08-15 LAB — RAPID HIV SCREEN (HIV 1/2 AB+AG)
HIV 1/2 ANTIBODIES: NONREACTIVE
HIV-1 P24 Antigen - HIV24: NONREACTIVE

## 2015-08-16 LAB — HEPATITIS PANEL, ACUTE
HCV AB: 0.2 {s_co_ratio} (ref 0.0–0.9)
HEP A IGM: NEGATIVE
Hep B C IgM: NEGATIVE
Hepatitis B Surface Ag: NEGATIVE

## 2016-11-17 ENCOUNTER — Emergency Department
Admission: EM | Admit: 2016-11-17 | Discharge: 2016-11-18 | Payer: Medicare Other | Attending: Emergency Medicine | Admitting: Emergency Medicine

## 2016-11-17 ENCOUNTER — Emergency Department: Payer: Medicare Other

## 2016-11-17 ENCOUNTER — Encounter: Payer: Self-pay | Admitting: Emergency Medicine

## 2016-11-17 DIAGNOSIS — Z79899 Other long term (current) drug therapy: Secondary | ICD-10-CM | POA: Insufficient documentation

## 2016-11-17 DIAGNOSIS — Y939 Activity, unspecified: Secondary | ICD-10-CM | POA: Diagnosis not present

## 2016-11-17 DIAGNOSIS — W0110XA Fall on same level from slipping, tripping and stumbling with subsequent striking against unspecified object, initial encounter: Secondary | ICD-10-CM | POA: Insufficient documentation

## 2016-11-17 DIAGNOSIS — S0990XA Unspecified injury of head, initial encounter: Secondary | ICD-10-CM | POA: Diagnosis not present

## 2016-11-17 DIAGNOSIS — Y998 Other external cause status: Secondary | ICD-10-CM | POA: Diagnosis not present

## 2016-11-17 DIAGNOSIS — F0390 Unspecified dementia without behavioral disturbance: Secondary | ICD-10-CM | POA: Insufficient documentation

## 2016-11-17 DIAGNOSIS — Y92129 Unspecified place in nursing home as the place of occurrence of the external cause: Secondary | ICD-10-CM | POA: Insufficient documentation

## 2016-11-17 DIAGNOSIS — F209 Schizophrenia, unspecified: Secondary | ICD-10-CM | POA: Diagnosis not present

## 2016-11-17 DIAGNOSIS — R42 Dizziness and giddiness: Secondary | ICD-10-CM | POA: Diagnosis present

## 2016-11-17 DIAGNOSIS — W19XXXA Unspecified fall, initial encounter: Secondary | ICD-10-CM

## 2016-11-17 DIAGNOSIS — T148XXA Other injury of unspecified body region, initial encounter: Secondary | ICD-10-CM

## 2016-11-17 LAB — CBC
HCT: 38.9 % (ref 35.0–47.0)
Hemoglobin: 12.9 g/dL (ref 12.0–16.0)
MCH: 30 pg (ref 26.0–34.0)
MCHC: 33.2 g/dL (ref 32.0–36.0)
MCV: 90.3 fL (ref 80.0–100.0)
PLATELETS: 258 10*3/uL (ref 150–440)
RBC: 4.31 MIL/uL (ref 3.80–5.20)
RDW: 13.3 % (ref 11.5–14.5)
WBC: 10.1 10*3/uL (ref 3.6–11.0)

## 2016-11-17 LAB — BASIC METABOLIC PANEL
Anion gap: 14 (ref 5–15)
BUN: 17 mg/dL (ref 6–20)
CALCIUM: 8.4 mg/dL — AB (ref 8.9–10.3)
CHLORIDE: 98 mmol/L — AB (ref 101–111)
CO2: 19 mmol/L — AB (ref 22–32)
CREATININE: 1.34 mg/dL — AB (ref 0.44–1.00)
GFR calc Af Amer: 43 mL/min — ABNORMAL LOW (ref >60)
GFR calc non Af Amer: 37 mL/min — ABNORMAL LOW (ref >60)
GLUCOSE: 626 mg/dL — AB (ref 65–99)
Potassium: 3.8 mmol/L (ref 3.5–5.1)
Sodium: 131 mmol/L — ABNORMAL LOW (ref 135–145)

## 2016-11-17 NOTE — ED Triage Notes (Signed)
Patient comes in [facility] via [facility] with unwitnessed fall. Per EMS there was blood in the middle of the floor when they arrived, patient was in the bed and walked to the stretcher. Patient complains of dizziness. Patient does have a hematoma and abrasion to the left forehead above the eye. Patient has dementia, a-fib and schizophrenia.

## 2016-11-17 NOTE — ED Notes (Signed)
Patient transported to CT via stretcher.

## 2016-11-18 DIAGNOSIS — S0990XA Unspecified injury of head, initial encounter: Secondary | ICD-10-CM | POA: Diagnosis not present

## 2016-11-18 LAB — URINALYSIS, COMPLETE (UACMP) WITH MICROSCOPIC
BACTERIA UA: NONE SEEN
BILIRUBIN URINE: NEGATIVE
Glucose, UA: >=500 mg/dL — AB
KETONES UR: 20 mg/dL — AB
Nitrite: NEGATIVE
PROTEIN: NEGATIVE mg/dL
Specific Gravity, Urine: 1.024 (ref 1.005–1.030)
pH: 6 (ref 5.0–8.0)

## 2016-11-18 LAB — GLUCOSE, CAPILLARY
GLUCOSE-CAPILLARY: 357 mg/dL — AB (ref 65–99)
Glucose-Capillary: 224 mg/dL — ABNORMAL HIGH (ref 65–99)

## 2016-11-18 LAB — TROPONIN I: Troponin I: <0.03 ng/mL (ref <0.03)

## 2016-11-18 MED ORDER — INSULIN ASPART 100 UNIT/ML ~~LOC~~ SOLN
10.0000 [IU] | Freq: Once | SUBCUTANEOUS | Status: AC
  Administered 2016-11-18: 10 [IU] via SUBCUTANEOUS
  Filled 2016-11-18: qty 10

## 2016-11-18 MED ORDER — BACITRACIN ZINC 500 UNIT/GM EX OINT
TOPICAL_OINTMENT | Freq: Once | CUTANEOUS | Status: AC
  Administered 2016-11-18: 1 via TOPICAL

## 2016-11-18 MED ORDER — SODIUM CHLORIDE 0.9 % IV BOLUS (SEPSIS)
1000.0000 mL | Freq: Once | INTRAVENOUS | Status: AC
  Administered 2016-11-18: 1000 mL via INTRAVENOUS

## 2016-11-18 MED ORDER — BACITRACIN ZINC 500 UNIT/GM EX OINT
TOPICAL_OINTMENT | CUTANEOUS | Status: AC
  Administered 2016-11-18: 1 via TOPICAL
  Filled 2016-11-18: qty 0.9

## 2016-11-18 NOTE — ED Provider Notes (Signed)
[facility] Emergency Department Provider Note   ____________________________________________   First MD Initiated Contact with Patient 11/17/16 2332     (approximate)  I have reviewed the triage vital signs and the nursing notes.   HISTORY  Chief Complaint Fall and Dizziness  The history is limited by the patient's dementia  HPI Kim Vaughn is a 76 y.o. female whocomes into the hospital today with a fall. The patient got up and she reports that she stumbled and fell on the carpeted. She denies any dizziness although she stated to EMS that she felt dizzy. The patient reports that she started walking and her feet just got tangled up. She denies frequent falls. The patient denies any loss of consciousness but does have pain over her left eye. The patient denies any chest pain, shortness of breath, abdominal pain, nausea, vomiting. She rates her pain a 9 out of 10 in intensity. According to the nurse and EMS it was an unwitnessed fall. The patient reports that she sits here because she fell and hit her head. She didn't take anything for the pain prior to coming into the hospital.   Past Medical History:  Diagnosis Date  . Dementia   . Depression   . Schizophrenia [facility])     Patient Active Problem List   Diagnosis Date Noted  . Paranoid schizophrenia (HCC)   . Schizophrenia (HCC) 08/13/2015  . Dementia 08/13/2015    History reviewed. No pertinent surgical history.  Prior to Admission medications   Medication Sig Start Date End Date Taking? Authorizing Provider  cephALEXin (KEFLEX) 500 MG capsule Take 1 capsule (500 mg total) by mouth 2 (two) times daily. 05/19/15   Gayla Doss, MD  escitalopram (LEXAPRO) 10 MG tablet Take 10 mg by mouth daily.    [provider]  QUEtiapine (SEROQUEL) 100 MG tablet Take 100 mg by mouth at bedtime.    [provider]    Allergies Iodine  No family history on file.  Social  History Social History  Substance Use Topics  . Smoking status: Never Smoker  . Smokeless tobacco: Not on file  . Alcohol use No    Review of Systems  Constitutional: No fever/chills Eyes: No visual changes. ENT: No sore throat. Cardiovascular: Denies chest pain. Respiratory: Denies shortness of breath. Gastrointestinal: No abdominal pain.  No nausea, no vomiting.  No diarrhea.  No constipation. Genitourinary: Negative for dysuria. Musculoskeletal: Negative for back pain. Skin: Abrasion over left eye Neurological: Dizziness   ____________________________________________   PHYSICAL EXAM:  VITAL SIGNS: ED Triage Vitals  Enc Vitals Group     BP 11/17/16 2300 (!) 160/77     Pulse Rate 11/17/16 2300 77     Resp 11/17/16 2300 18     Temp 11/17/16 2303 98 F (36.7 C)     Temp Source 11/17/16 2303 Oral     SpO2 11/17/16 2300 92 %     Weight --      Height --      Head Circumference --      Peak Flow --      Pain Score --      Pain Loc --      Pain Edu? --      Excl. in GC? --     Constitutional: Alert and oriented To self, patient does not know the date or what hospital she is located. Well appearing and in no acute distress. Eyes: Conjunctivae are normal. PERRL. EOMI. Head:  Abrasion/skin tear over left eye Nose: No congestion/rhinnorhea. Mouth/Throat: Mucous membranes are moist.  Oropharynx non-erythematous. Cardiovascular: Normal rate, regular rhythm. Grossly normal heart sounds.  Good peripheral circulation. Respiratory: Normal respiratory effort.  No retractions. Lungs CTAB. Gastrointestinal: Soft and nontender. No distention. Positive bowel sounds Musculoskeletal: No lower extremity tenderness nor edema.   Neurologic:  Normal speech and language.  Skin:  Skin is warm, dry and intact.  Psychiatric: Mood and affect are normal.   ____________________________________________   LABS (all labs ordered are listed, but only abnormal results are displayed)  Labs  Reviewed  BASIC METABOLIC PANEL - Abnormal; Notable for the following:       Result Value   Sodium 131 (*)    Chloride 98 (*)    CO2 19 (*)    Glucose, Bld 626 (*)    Creatinine, Ser 1.34 (*)    Calcium 8.4 (*)    GFR calc non Af Amer 37 (*)    GFR calc Af Amer 43 (*)    All other components within normal limits  URINALYSIS, COMPLETE (UACMP) WITH MICROSCOPIC - Abnormal; Notable for the following:    Color, Urine YELLOW (*)    APPearance HAZY (*)    Glucose, UA >=500 (*)    Hgb urine dipstick MODERATE (*)    Ketones, ur 20 (*)    Leukocytes, UA SMALL (*)    Squamous Epithelial / LPF 0-5 (*)    All other components within normal limits  GLUCOSE, CAPILLARY - Abnormal; Notable for the following:    Glucose-Capillary 357 (*)    All other components within normal limits  GLUCOSE, CAPILLARY - Abnormal; Notable for the following:    Glucose-Capillary 224 (*)    All other components within normal limits  URINE CULTURE  CBC  TROPONIN I  CBG MONITORING, ED  CBG MONITORING, ED  CBG MONITORING, ED   ____________________________________________  EKG  ED ECG REPORT I, Rebecka ApleyWebster,  Jordanne Elsbury P, the attending physician, personally viewed and interpreted this ECG.   Date: 11/17/2016  EKG Time: 2300  Rate: 77  Rhythm: normal sinus rhythm  Axis: normal  Intervals:none  ST&T Change: none  ____________________________________________  RADIOLOGY  CT head CT cervical spine ____________________________________________   PROCEDURES  Procedure(s) performed: None  Procedures  Critical Care performed: No  ____________________________________________   INITIAL IMPRESSION / ASSESSMENT AND PLAN / ED COURSE  Pertinent labs & imaging results that were available during my care of the patient were reviewed by me and considered in my medical decision making (see chart for details).  This is a 76 year old female who comes into the hospital today after a fall at her nursing home. The  patient initially told EMS that she was dizzy but she is stating that she was not dizzy nor had any other symptoms. She states that her fetus got tangled up. The patient's blood sugar though is 626. I did give the patient a liter of normal saline as well as 10 units of insulin subcutaneously. The patient's blood sugar did come down to about 224. The patient's imaging studies are unremarkable. The patient's urine shows a hemoglobin with no red blood cells. I will send the patient's urine for a culture and she will be discharged to home. The patient has no further complaints at this time.  Clinical Course as of Nov 19 507  Fri Nov 18, 2016  0026 1. No acute intracranial process. 2. Soft tissue contusion at the left periorbital region. 3. Mild age-related cerebral atrophy  with chronic small vessel ischemic disease.  CT CERVICAL SPINE:  1. No acute traumatic injury within the cervical spine. 2. Moderate to advanced degenerative disc disease at C5-6 and C6-7 with multilevel facet arthrosis, greater on the left.   CT Head Wo Contrast [AW]    Clinical Course User Index [AW] Rebecka Apley, MD     ____________________________________________   FINAL CLINICAL IMPRESSION(S) / ED DIAGNOSES  Final diagnoses:  Fall, initial encounter  Abrasion  Injury of head, initial encounter  Dizziness      NEW MEDICATIONS STARTED DURING THIS VISIT:  New Prescriptions   No medications on file     Note:  This document was prepared using Dragon voice recognition software and may include unintentional dictation errors.    Rebecka Apley, MD 11/18/16 [phone]

## 2016-11-18 NOTE — ED Notes (Signed)

## 2016-11-18 NOTE — ED Notes (Signed)
[place]. [place] resting comfortably; updated on status (waiting for EMS pickup). Pt denies pain, needs at this time. Will continue to monitor.

## 2016-11-18 NOTE — ED Notes (Signed)
Report given to Rinaldo CloudPamela, med tech, at [facility]. D/C instructions and follow-up care gone over with Rinaldo CloudPamela, med tech.

## 2016-11-18 NOTE — ED Notes (Signed)
Bacitracin and dressing applied to left eyebrow.

## 2016-11-19 LAB — URINE CULTURE

## 2016-11-22 IMAGING — CR DG CHEST 2V
1 series · 2 of 2 positions shown · non-contrast
Comparison: None.

CLINICAL DATA: Pt brought to ED by police for behavior [REDACTED]; pt
states feeling 'shaky and nervous' since last night. Denies chest
pain or shortness of breath, no known cardiac hx.

EXAM:
CHEST  2 VIEW

[Series 1: dxr chest pa (or ap) and lateral · 0.14mm/px · 2 of 2 slices shown]
[im 1/2]
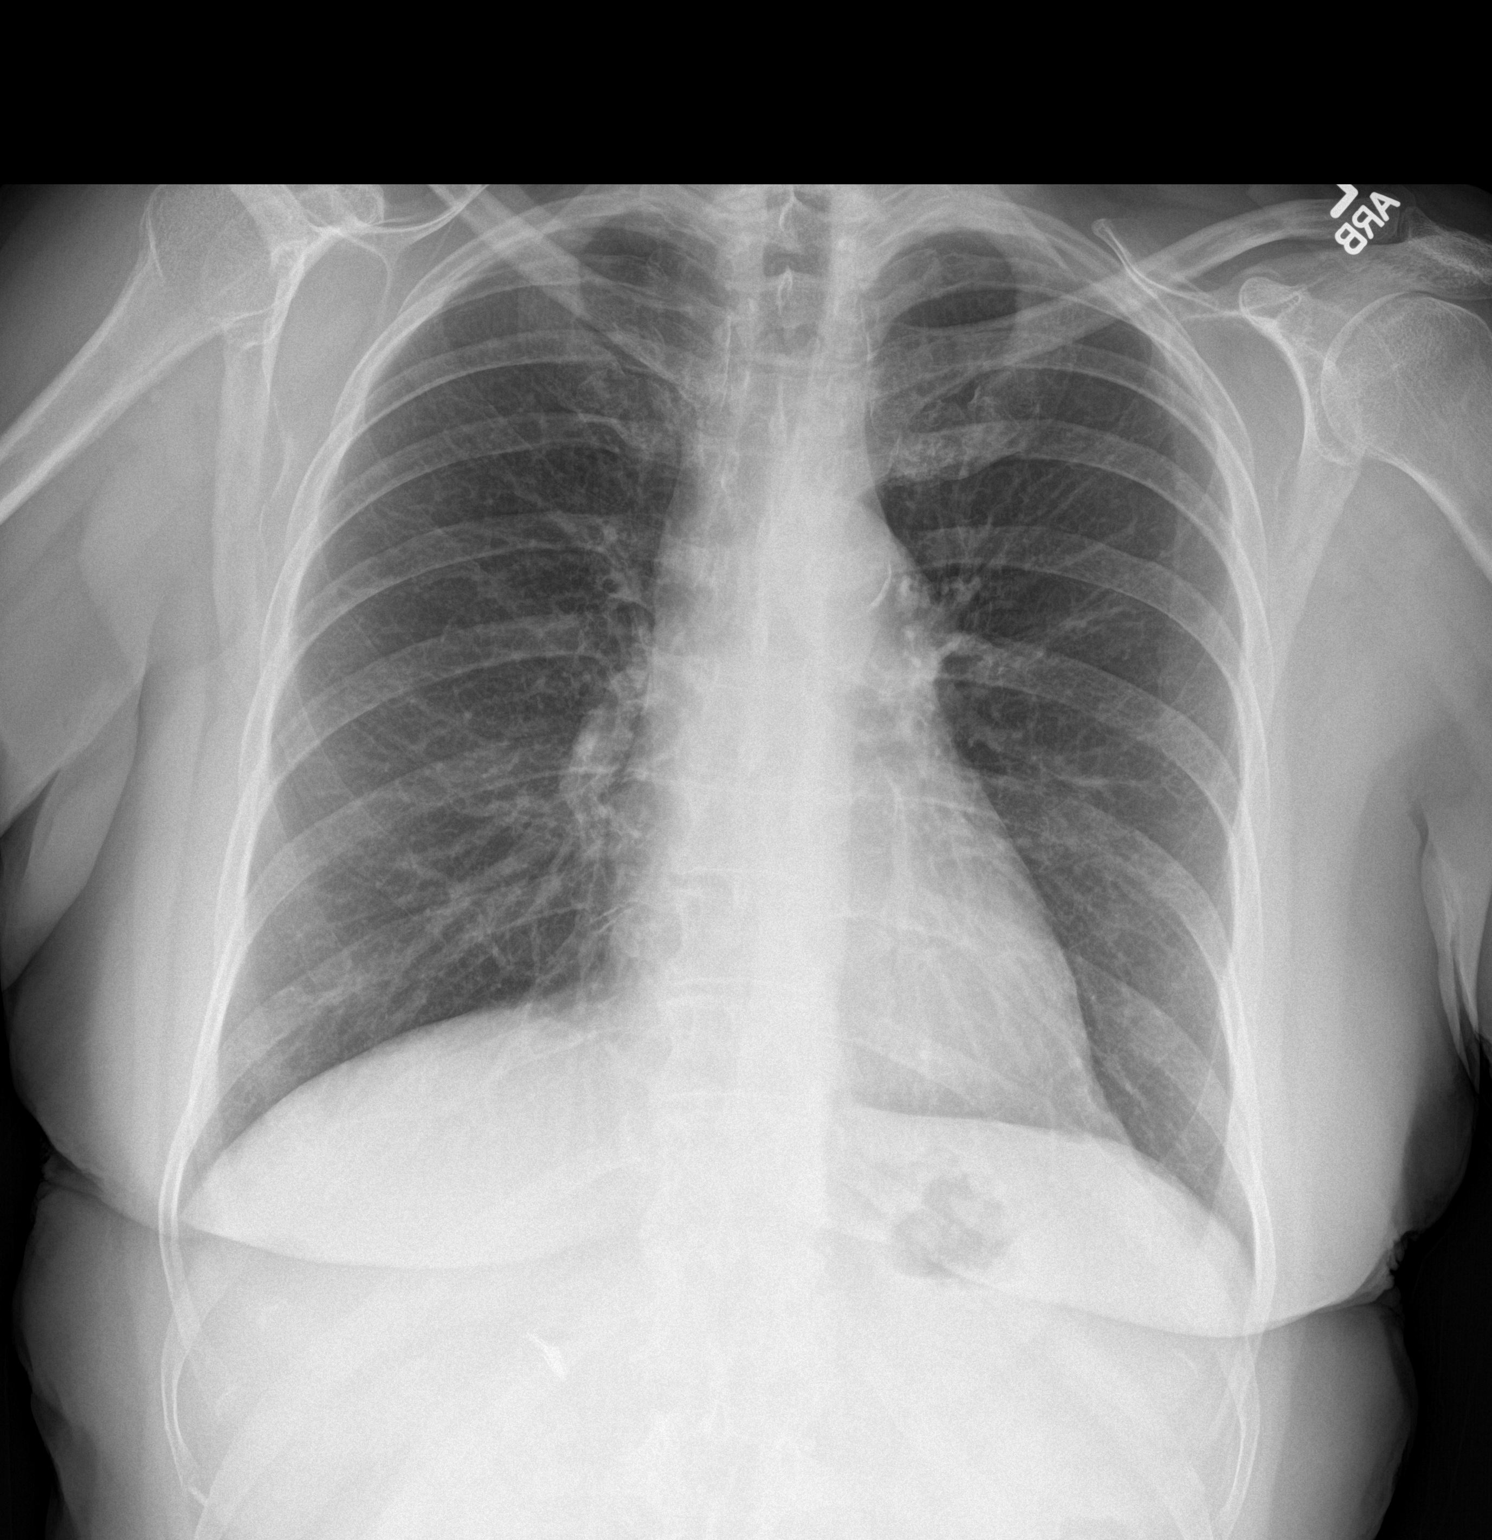
[im 2/2]
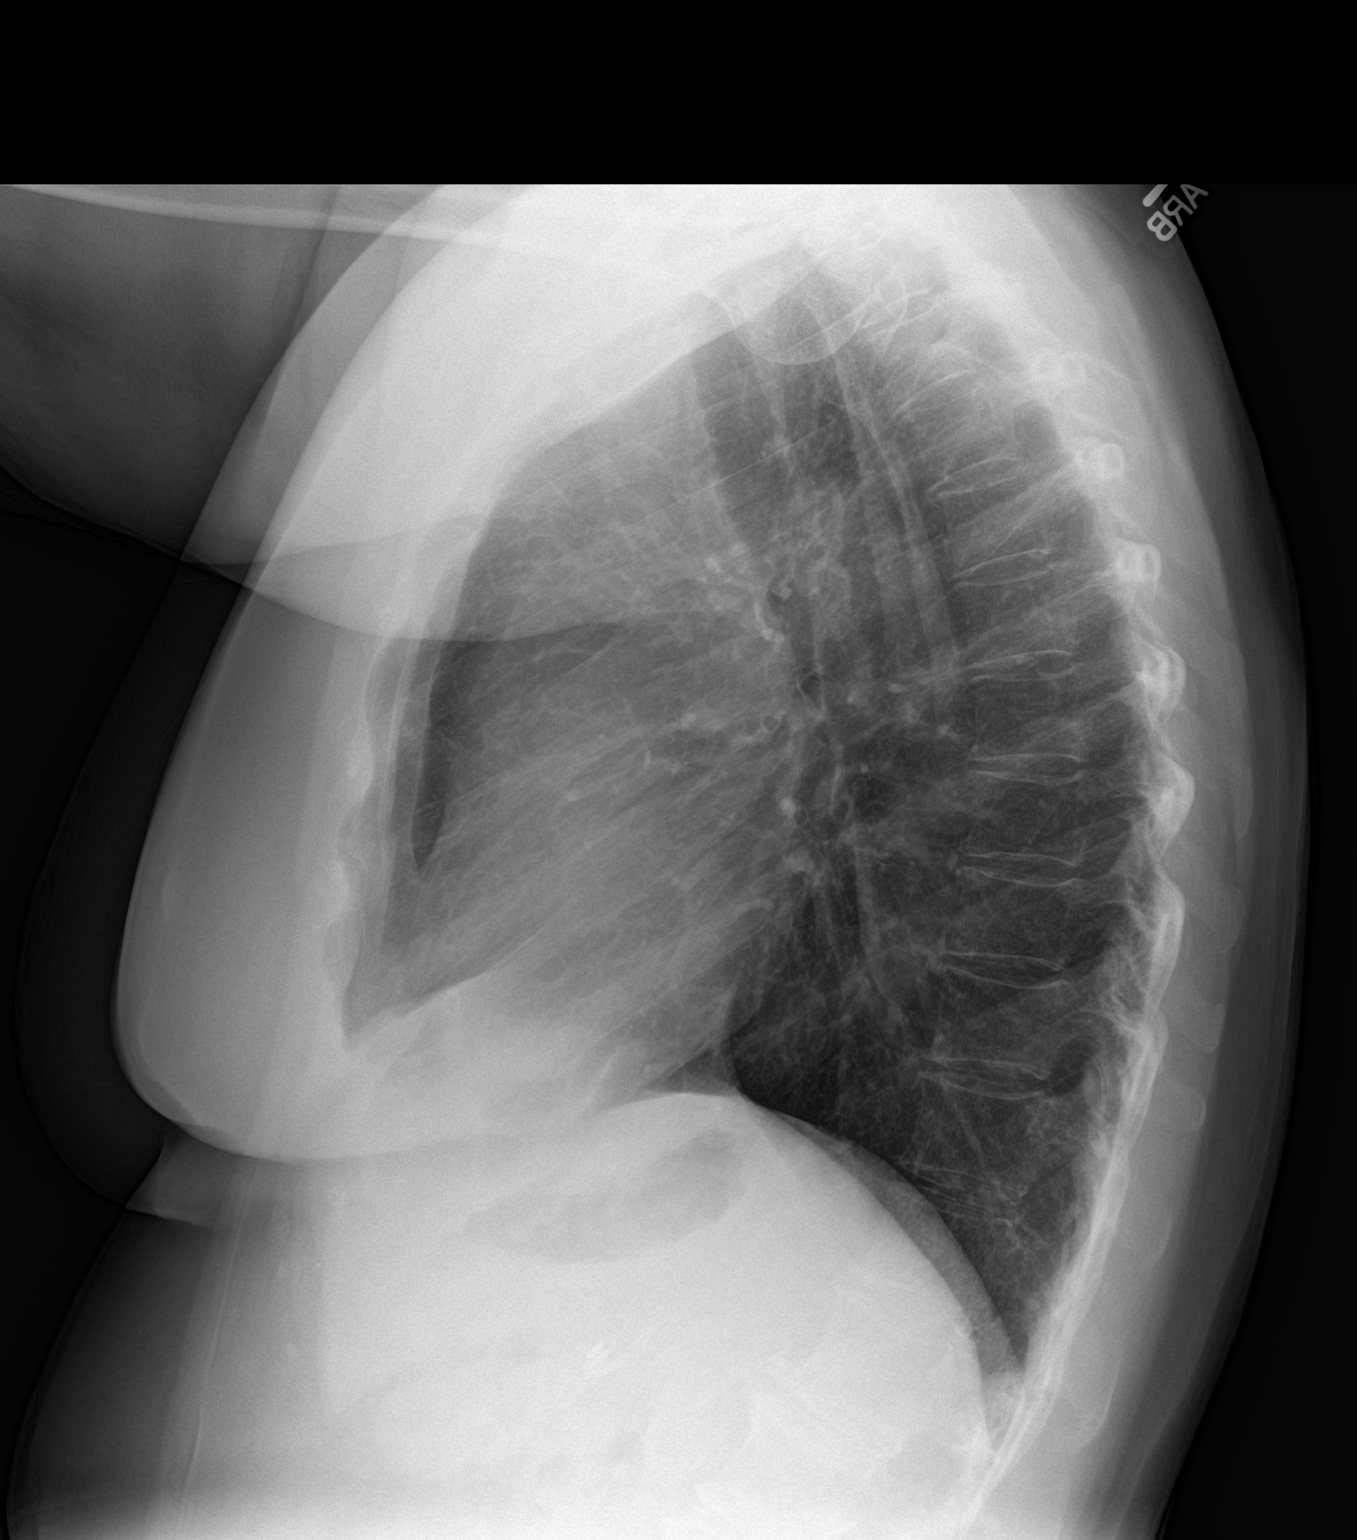

[2 of 2 positions shown; findings below may reference images not displayed]

FINDINGS: Cardiac silhouette normal in size and configuration.

Normal mediastinal and hilar contours.

Lungs are clear.  No pleural effusion or pneumothorax.

Bony thorax is demineralized but intact.
IMPRESSION: No active cardiopulmonary disease.
